# Patient Record
Sex: Female | Born: 2000 | Hispanic: No | Marital: Single | Smoking: Never smoker
Health system: Southern US, Community
[De-identification: ages and names within clinical notes are randomized; demographics above are authoritative.]

## PROBLEM LIST (undated history)

## (undated) DIAGNOSIS — F909 Attention-deficit hyperactivity disorder, unspecified type: Secondary | ICD-10-CM

## (undated) DIAGNOSIS — H5213 Myopia, bilateral: Secondary | ICD-10-CM

## (undated) DIAGNOSIS — L309 Dermatitis, unspecified: Secondary | ICD-10-CM

## (undated) HISTORY — DX: Attention-deficit hyperactivity disorder, unspecified type: F90.9

## (undated) HISTORY — DX: Myopia, bilateral: H52.13

## (undated) HISTORY — DX: Dermatitis, unspecified: L30.9

---
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
## 2021-02-21 ENCOUNTER — Encounter (HOSPITAL_COMMUNITY): Payer: Self-pay | Admitting: Emergency Medicine

## 2021-02-21 ENCOUNTER — Emergency Department (HOSPITAL_COMMUNITY)
Admission: EM | Admit: 2021-02-21 | Discharge: 2021-02-21 | Disposition: A | Discharge status: Home / Self Care | Payer: Medicaid Other | Attending: Emergency Medicine | Admitting: Emergency Medicine

## 2021-02-21 ENCOUNTER — Other Ambulatory Visit: Payer: Self-pay

## 2021-02-21 DIAGNOSIS — W25XXXA Contact with sharp glass, initial encounter: Secondary | ICD-10-CM | POA: Insufficient documentation

## 2021-02-21 DIAGNOSIS — Y93G1 Activity, food preparation and clean up: Secondary | ICD-10-CM | POA: Diagnosis not present

## 2021-02-21 DIAGNOSIS — S61011A Laceration without foreign body of right thumb without damage to nail, initial encounter: Secondary | ICD-10-CM | POA: Insufficient documentation

## 2021-02-21 DIAGNOSIS — S60931A Unspecified superficial injury of right thumb, initial encounter: Secondary | ICD-10-CM | POA: Diagnosis present

## 2021-02-21 MED ORDER — BACITRACIN ZINC 500 UNIT/GM EX OINT
1.0000 "application " | TOPICAL_OINTMENT | Freq: Two times a day (BID) | CUTANEOUS | Status: DC
  Administered 2021-02-21: 1 via TOPICAL
  Filled 2021-02-21: qty 0.9

## 2021-02-21 MED ORDER — LIDOCAINE-EPINEPHRINE 2 %-1:100000 IJ SOLN: 20.0000 mL | Freq: Once | INTRAMUSCULAR | Status: AC

## 2021-02-21 MED ORDER — LIDOCAINE-EPINEPHRINE 2 %-1:100000 IJ SOLN
INTRAMUSCULAR | Status: AC
  Administered 2021-02-21: 20 mL
  Filled 2021-02-21: qty 1

## 2021-02-21 NOTE — ED Triage Notes (Signed)
Patient states she was washing dishes and a cup broke. Cut noted to right thumb on the lower knuckle. Minimal bleeding.

## 2021-02-21 NOTE — ED Provider Notes (Signed)
[facility] DEPT Provider Note   CSN: [id number] Arrival date & time: 02/21/21  0133     History Chief Complaint  Patient presents with  . Laceration    Pamela Fernandez is a 20 y.o. female.  Patient presents to the emergency department with a chief complaint of laceration.  She states that she was doing dishes and a glass broke cutting her right thumb.  She denies any other injuries.  Denies any difficulty moving her thumb.  Bleeding has been difficult to control.  The history is provided by the patient. No language interpreter was used.       Past Medical History:  Diagnosis Date  . ADHD (attention deficit hyperactivity disorder)   . At risk for tuberculosis 04/13/2018   Travel to [place],  summer 2018 May 2019 not yet tested for latent tuberculosis   . Eczema   . Myopia of both eyes 10/15/2013    Patient Active Problem List   Diagnosis Date Noted  . Hx of foreign travel 02/14/2019  . Bereavement 02/13/2019  . Goiter 09/09/2015  . Allergic rhinitis 08/19/2014  . Allergic conjunctivitis 08/19/2014  . Eczema 08/19/2014  . ADHD (attention deficit hyperactivity disorder) 10/15/2013  . Myopia of both eyes 10/15/2013    History reviewed. No pertinent surgical history.   OB History   No obstetric history on file.     Family History  Problem Relation Age of Onset  . Hypertension Mother   . Asthma Brother   . Eczema Brother   . Thyroid disease Other     Social History   Tobacco Use  . Smoking status: Never Smoker  . Smokeless tobacco: Never Used  Substance Use Topics  . Alcohol use: No  . Drug use: No    Home Medications Prior to Admission medications   Medication Sig Start Date End Date Taking? Authorizing Provider  cetirizine (ZYRTEC) 10 MG tablet Take 1 tablet (10 mg total) by mouth daily. 04/07/20   [name], MD  ferrous sulfate 325 (65 FE) MG tablet Take 1 tablet (325 mg total) by mouth daily. 10/10/19   [name], MD  fluticasone (FLONASE) 50 MCG/ACT nasal spray Place 1 spray into both nostrils daily. 1 spray in each nostril every day 04/07/20   [name], MD  Olopatadine HCl 0.2 % SOLN Apply 1 drop to eye daily. 04/07/20   [name], MD  triamcinolone ointment (KENALOG) 0.1 % APPLY TO AFFECTED AREA TWICE A DAY 04/07/20   [name], MD  triamcinolone ointment (KENALOG) 0.5 % Apply 1 application topically 2 (two) times daily. For moderate to severe eczema.  Do not use for more than 1 week at a time. 04/07/20   [name], MD  VYVANSE 40 MG capsule Take 1 capsule (40 mg total) by mouth every morning. 12/22/20 01/21/21  [name], MD  VYVANSE 40 MG capsule Take 1 capsule (40 mg total) by mouth every morning. 11/21/20 12/21/20  [name], MD  VYVANSE 40 MG capsule Take 1 capsule (40 mg total) by mouth every morning. 10/22/20 11/22/20  [name], MD    Allergies    Patient has no known allergies.  Review of Systems   Review of Systems  All other systems reviewed and are negative.   Physical Exam Updated Vital Signs BP 118/73 (BP Location: Right Arm)   Pulse 80   Temp 98.3 F (36.8 C) (Oral)   Resp 18   SpO2 100%   Physical Exam Vitals and  nursing note reviewed.  Constitutional:      General: She is not in acute distress.    Appearance: She is well-developed.  HENT:     Head: Normocephalic and atraumatic.  Eyes:     Conjunctiva/sclera: Conjunctivae normal.  Cardiovascular:     Rate and Rhythm: Normal rate.     Heart sounds: No murmur heard.   Pulmonary:     Effort: Pulmonary effort is normal. No respiratory distress.  Abdominal:     General: There is no distension.  Musculoskeletal:     Cervical back: Neck supple.     Comments: Moves all extremities  Skin:    General: Skin is warm and dry.     Comments: 1 cm laceration over the right first MCP, no tendon, ligament, or joint involvement.  Neurological:     Mental Status: She  is alert and oriented to person, place, and time.  Psychiatric:        Mood and Affect: Mood normal.        Behavior: Behavior normal.     ED Results / Procedures / Treatments   Labs (all labs ordered are listed, but only abnormal results are displayed) Labs Reviewed - No data to display  EKG None  Radiology No results found.  Procedures .Marland KitchenLaceration Repair  Date/Time: 02/21/2021 3:43 AM Performed by: Montine Circle, PA-C Authorized by: Montine Circle, PA-C   Consent:    Consent obtained:  Verbal   Consent given by:  Patient   Risks discussed:  Infection, need for additional repair, pain, poor cosmetic result and poor wound healing   Alternatives discussed:  No treatment and delayed treatment Universal protocol:    Procedure explained and questions answered to patient or proxy's satisfaction: yes     Relevant documents present and verified: yes     Test results available: yes     Imaging studies available: yes     Required blood products, implants, devices, and special equipment available: yes     Site/side marked: yes     Immediately prior to procedure, a time out was called: yes     Patient identity confirmed:  Verbally with patient Anesthesia:    Anesthesia method:  Local infiltration Laceration details:    Location:  Finger   Finger location:  R thumb   Length (cm):  1 Pre-procedure details:    Preparation:  Patient was prepped and draped in usual sterile fashion Exploration:    Wound exploration: wound explored through full range of motion and entire depth of wound visualized     Wound extent: no foreign bodies/material noted, no muscle damage noted, no tendon damage noted and no vascular damage noted     Contaminated: no   Treatment:    Irrigation solution:  Tap water Skin repair:    Repair method:  Sutures   Suture size:  5-0   Suture material:  Prolene   Number of sutures:  3 Approximation:    Approximation:  Close Repair type:    Repair type:   Simple Post-procedure details:    Dressing:  Antibiotic ointment and adhesive bandage   Procedure completion:  Tolerated well, no immediate complications     Medications Ordered in ED Medications  lidocaine-EPINEPHrine (XYLOCAINE W/EPI) 2 %-1:100000 (with pres) injection (has no administration in time range)    ED Course  I have reviewed the triage vital signs and the nursing notes.  Pertinent labs & imaging results that were available during my care of the patient were  reviewed by me and considered in my medical decision making (see chart for details).    MDM Rules/Calculators/A&P                          Patient here with simple laceration to the posterior aspect of the right thumb, no tendon involvement.  Repaired without difficulty or complication. Final Clinical Impression(s) / ED Diagnoses Final diagnoses:  Laceration of right thumb without foreign body without damage to nail, initial encounter    Rx / DC Orders ED Discharge Orders    None       Montine Circle, PA-C 01/56/15 [id number]    Delora Fuel, MD 32/76/14 2239

## 2021-02-22 ENCOUNTER — Telehealth: Payer: Self-pay | Admitting: *Deleted

## 2021-02-22 NOTE — Telephone Encounter (Signed)
Transition Care Management Unsuccessful Follow-up Telephone Call  Date of discharge and from where:  02/21/2021 - [facility]  Attempts:  1st Attempt  Reason for unsuccessful TCM follow-up call:  Left voice message

## 2021-02-23 NOTE — Telephone Encounter (Signed)
Transition Care Management Unsuccessful Follow-up Telephone Call  Date of discharge and from where:  02/21/2021 - [facility]  Attempts:  2nd Attempt  Reason for unsuccessful TCM follow-up call:  Voice mail full

## 2021-02-24 NOTE — Telephone Encounter (Signed)
Transition Care Management Unsuccessful Follow-up Telephone Call  Date of discharge and from where:  02/21/2021 - [facility]  Attempts:  3rd Attempt  Reason for unsuccessful TCM follow-up call:  Voice mail full   Has appointment with PCP on 02/25/2021

## 2021-02-25 ENCOUNTER — Ambulatory Visit (INDEPENDENT_AMBULATORY_CARE_PROVIDER_SITE_OTHER): Payer: Medicaid Other | Admitting: Pediatrics

## 2021-02-25 ENCOUNTER — Other Ambulatory Visit: Payer: Self-pay

## 2021-02-25 VITALS — Temp 97.3°F | Wt 108.0 lb

## 2021-02-25 DIAGNOSIS — R000 Tachycardia, unspecified: Secondary | ICD-10-CM | POA: Diagnosis not present

## 2021-02-25 DIAGNOSIS — F900 Attention-deficit hyperactivity disorder, predominantly inattentive type: Secondary | ICD-10-CM

## 2021-02-25 DIAGNOSIS — L659 Nonscarring hair loss, unspecified: Secondary | ICD-10-CM

## 2021-02-25 DIAGNOSIS — E559 Vitamin D deficiency, unspecified: Secondary | ICD-10-CM | POA: Diagnosis not present

## 2021-02-25 DIAGNOSIS — E049 Nontoxic goiter, unspecified: Secondary | ICD-10-CM

## 2021-02-25 DIAGNOSIS — R634 Abnormal weight loss: Secondary | ICD-10-CM

## 2021-02-25 MED ORDER — VYVANSE 40 MG PO CAPS: 40.0000 mg | ORAL_CAPSULE | ORAL | 0 refills | Status: DC

## 2021-02-25 MED ORDER — VYVANSE 40 MG PO CAPS
40.0000 mg | ORAL_CAPSULE | ORAL | 0 refills | Status: DC
Start: 2021-04-27 — End: 2021-05-31

## 2021-02-25 MED ORDER — VYVANSE 40 MG PO CAPS
40.0000 mg | ORAL_CAPSULE | ORAL | 0 refills | Status: DC
Start: 2021-03-28 — End: 2021-05-31

## 2021-02-25 NOTE — Progress Notes (Signed)
Subjective:     Pamela Fernandez, is a 20 y.o. female  HPI  Chief Complaint  Patient presents with  . Anemia    She is requesting blood work, she has hair loss, irregular heart beat   At [employer], last sememster was easy Took too many  (7 ) classes this semester; had to drop one,   More than usual hair loss  Hair loss at front scalp forehead area noticible Using minoxidil cream More since January  Never had Story Mom and dad were sick October--light cold Patient had no symptoms and was tested--negative Daughter was COVID 12/2019   For two weeks, has had several troubling to her symptoms Did internet search, and she thinks it is hypothyroid Constipation, Trouble sleeping--insomnia Cold intolerence, but also sweating At same time, had  A lot of exams and couldn't remember anyting Had dry skin,  Some of the symptoms better: but still constipation Heart felt fast Particularly,  the night she could n't sleep , felt her heart in her head  Mom reports patient is a straight A student and that she worries about her grades all the time.  Mom says patient is Always worried, always stressed   ADHD Needs refill Duration mid afternoon--then can't study again Has been on afternood [facility] in past Was on vyvanse 34 and took two pills as a time (on her own) because vyvanse wasn't working--how she got on 40 from 20 without trial of 30  Needs to gain weight --has some recent weight loss No eat in the morning --is hungry, but no time Eat junk food, lots of it when hungry Eats better in morning before takes Vyvanse, later in the day, she is not hungry for a while  Review of Systems   The following portions of the patient's history were reviewed and updated as appropriate: allergies, current medications, past family history, past medical history, past social history, past surgical history and problem list.  History and Problem List: Pamela Fernandez has ADHD (attention deficit hyperactivity disorder);  Myopia of both eyes; Allergic rhinitis; Allergic conjunctivitis; Eczema; Goiter; Bereavement; and Hx of foreign travel on their problem list.  Pamela Fernandez  has a past medical history of ADHD (attention deficit hyperactivity disorder), At risk for tuberculosis (04/13/2018), Eczema, and Myopia of both eyes (10/15/2013).     Objective:     Temp (!) 97.3 F (36.3 C) (Temporal)   Wt 108 lb (49 kg)   BMI 19.13 kg/m   Physical Exam Constitutional:      Appearance: Normal appearance.     Comments: Always thin, no significant visible difference in weight  HENT:     Head: Normocephalic.     Comments: Thick hair, no areas of frank alopecia, possible thinning along forehead    Right Ear: Tympanic membrane normal.     Left Ear: Tympanic membrane normal.     Nose: Nose normal.     Mouth/Throat:     Mouth: Mucous membranes are moist.     Pharynx: Oropharynx is clear.  Eyes:     Conjunctiva/sclera: Conjunctivae normal.  Neck:     Comments: Goiter present, but no increased,  Cardiovascular:     Rate and Rhythm: Normal rate and regular rhythm.     Heart sounds: No murmur heard.   Pulmonary:     Effort: Pulmonary effort is normal.     Breath sounds: Normal breath sounds.  Abdominal:     General: Abdomen is flat.     Palpations: Abdomen is soft.  Tenderness: There is no abdominal tenderness.  Musculoskeletal:     Cervical back: Normal range of motion. No tenderness.  Neurological:     Mental Status: She is alert.        Assessment & Plan:   1. Attention deficit hyperactivity disorder (ADHD), predominantly inattentive type  I am willing to add on an afternoon dose of methylphenidate as she was on in the past except that, with her recent weight loss and anxiety, I would like to decrease her morning dose to Vyvanse 30. She was unwilling to consider a decrease to Vyvanse of 30 in the morning and preferred to continue with Vyvanse 40 without an afternoon dose.  - VYVANSE 40 MG capsule;  Take 1 capsule (40 mg total) by mouth every morning.  Dispense: 30 capsule; Refill: 0 - VYVANSE 40 MG capsule; Take 1 capsule (40 mg total) by mouth every morning.  Dispense: 30 capsule; Refill: 0 - VYVANSE 40 MG capsule; Take 1 capsule (40 mg total) by mouth every morning.  Dispense: 30 capsule; Refill: 0  2. 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 Appropriate to check screening for anemia and thyroid as well as vitamin D as she wears a head scarf and stays indoors most of the day. Results included normal CBC and low vitamin D  Recommended daily multivitamin with iron and 1000 units vitamin D3 daily  - VITAMIN D 25 Hydroxy (Vit-D Deficiency, Fractures) - CBC  3. Goiter Thyroid thyroid studies were normal in November but has new concern regarding both high heart rate and hair loss will be checked today  - T4, free - TSH  4. Tachycardia Seems to have been particularly associated with 1 night when she was having trouble sleeping and thinking.  Exam is normal. Not concerned about cardiac origin  5. Weight loss Strongly encouraged to eat breakfast.  A glass of milk or a piece of toast might be sufficient.  Take food with her that she can eat between classes.  Needs to gain a little weight before could add an afternoon dose.  3 months of Vyvanse provided but would recommend return visit sooner if she has recurring symptoms  Supportive care and return precautions reviewed.  Spent  30  minutes reviewing charts, discussing diagnosis and treatment plan with patient, documentation and case coordination.   [name], MD

## 2021-02-26 ENCOUNTER — Encounter: Payer: Self-pay | Admitting: Pediatrics

## 2021-02-26 LAB — CBC
HCT: 35.5 % (ref 34.0–46.0)
Hemoglobin: 11.7 g/dL (ref 11.5–15.3)
MCH: 25.7 pg (ref 25.0–35.0)
MCHC: 33 g/dL (ref 31.0–36.0)
MCV: 77.9 fL — ABNORMAL LOW (ref 78.0–98.0)
MPV: 11.5 fL (ref 7.5–12.5)
Platelets: 273 10*3/uL (ref 140–400)
RBC: 4.56 10*6/uL (ref 3.80–5.10)
RDW: 13.4 % (ref 11.0–15.0)
WBC: 5 10*3/uL (ref 4.5–13.0)

## 2021-02-26 LAB — T4, FREE: Free T4: 1.4 ng/dL (ref 0.8–1.4)

## 2021-02-26 LAB — VITAMIN D 25 HYDROXY (VIT D DEFICIENCY, FRACTURES): Vit D, 25-Hydroxy: 16 ng/mL — ABNORMAL LOW (ref 30–100)

## 2021-02-26 LAB — TSH: TSH: 1.65 mIU/L

## 2021-02-26 NOTE — Patient Instructions (Signed)

## 2021-05-27 ENCOUNTER — Ambulatory Visit: Payer: Medicaid Other | Admitting: Pediatrics

## 2021-05-31 ENCOUNTER — Ambulatory Visit (INDEPENDENT_AMBULATORY_CARE_PROVIDER_SITE_OTHER): Payer: Medicaid Other | Admitting: Pediatrics

## 2021-05-31 ENCOUNTER — Other Ambulatory Visit: Payer: Self-pay

## 2021-05-31 ENCOUNTER — Encounter: Payer: Self-pay | Admitting: Pediatrics

## 2021-05-31 VITALS — BP 120/68 | HR 60 | Ht 62.91 in | Wt 109.0 lb

## 2021-05-31 DIAGNOSIS — J302 Other seasonal allergic rhinitis: Secondary | ICD-10-CM

## 2021-05-31 DIAGNOSIS — F900 Attention-deficit hyperactivity disorder, predominantly inattentive type: Secondary | ICD-10-CM

## 2021-05-31 DIAGNOSIS — L2082 Flexural eczema: Secondary | ICD-10-CM

## 2021-05-31 DIAGNOSIS — E559 Vitamin D deficiency, unspecified: Secondary | ICD-10-CM

## 2021-05-31 MED ORDER — VYVANSE 40 MG PO CAPS: 40.0000 mg | ORAL_CAPSULE | ORAL | 0 refills | Status: DC

## 2021-05-31 NOTE — Progress Notes (Signed)
Subjective:     Pamela Fernandez, is a 20 y.o. female  HPI  Chief Complaint  Patient presents with   Follow-up    ADHD   Has an exam today at [employer] It is a memorized of Islam --nervous for it,  Needs to be seen for refills for Vyvanse Has been on the same , Stable dose  First year in college; it was hard Especially the second semester On track to be accepted to [employer] , living at home Moving to another house in [place] soon About to start work at [employer] start next week  Ran out a while ago Vyvanse 40 mg  Is the right dose No Side effects: no HA, no dizzy, no abd pain, no mood changes such as anxiety or depression   Had hair loss Hair using spray minoxidil  From Dermatologist--in [place]-no notes able to be seen   No longer taking iron  Allergies are fine, ok for refills if needed   Takes vit D: 2 gummy vit d   Skin--dry, but not exacerbation No using steroid cream often  Is using moisturizer  Review of Systems   The following portions of the patient's history were reviewed and updated as appropriate: allergies, current medications, past family history, past medical history, past social history, past surgical history, and problem list.  History and Problem List: Pamela Fernandez has ADHD (attention deficit hyperactivity disorder); Myopia of both eyes; Allergic rhinitis; Allergic conjunctivitis; Eczema; Goiter; Bereavement; and Hx of foreign travel on their problem list.  Pamela Fernandez  has a past medical history of ADHD (attention deficit hyperactivity disorder), At risk for tuberculosis (04/13/2018), Eczema, and Myopia of both eyes (10/15/2013).     Objective:     BP 120/68 (BP Location: Right Arm, Patient Position: Sitting, Cuff Size: Normal)   Pulse 60   Ht 5' 2.91" (1.598 m)   Wt 109 lb (49.4 kg)   BMI 19.36 kg/m   Physical Exam Vitals and nursing note reviewed.  Constitutional:      General: She is not in acute distress. HENT:     Head:  Normocephalic and atraumatic.     Comments: Slight goiter    Right Ear: External ear normal.     Left Ear: External ear normal.     Nose: Nose normal.  Eyes:     General:        Right eye: No discharge.        Left eye: No discharge.     Conjunctiva/sclera: Conjunctivae normal.  Cardiovascular:     Rate and Rhythm: Normal rate and regular rhythm.     Heart sounds: Normal heart sounds.  Pulmonary:     Effort: No respiratory distress.     Breath sounds: No wheezing or rales.  Musculoskeletal:     Cervical back: Normal range of motion.  Skin:    General: Skin is warm and dry.     Comments: Bilateral antecubital with slightly thickened and day dry areas with occasional pinpoint scab       Assessment & Plan:   1. 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 Vitamin D deficiency Taking sumplements  3. Seasonal allergic rhinitis, unspecified trigger Stable, ok for refills if needed  4. Flexural eczema Could benefit from more moisturizer and steroid cream occasionally,  No active infection  Thyroid studies normal with last check in 02/2021  Supportive care  and return precautions reviewed.  Spent  30  minutes reviewing charts, discussing diagnosis and treatment plan with patient, documentation .   [name], MD

## 2021-07-19 ENCOUNTER — Ambulatory Visit (INDEPENDENT_AMBULATORY_CARE_PROVIDER_SITE_OTHER): Payer: Medicaid Other | Admitting: Pediatrics

## 2021-07-19 ENCOUNTER — Encounter: Payer: Self-pay | Admitting: Pediatrics

## 2021-07-19 ENCOUNTER — Other Ambulatory Visit: Payer: Self-pay

## 2021-07-19 VITALS — Temp 98.9°F | Ht 63.0 in | Wt 110.5 lb

## 2021-07-19 DIAGNOSIS — B349 Viral infection, unspecified: Secondary | ICD-10-CM | POA: Diagnosis not present

## 2021-07-19 LAB — POC SOFIA SARS ANTIGEN FIA: SARS Coronavirus 2 Ag: NEGATIVE

## 2021-07-19 LAB — POCT RAPID STREP A (OFFICE): Rapid Strep A Screen: NEGATIVE

## 2021-07-19 NOTE — Progress Notes (Signed)
PCP: [name], MD   Chief Complaint  Patient presents with   Sore Throat      Subjective:  HPI:  Pamela Fernandez is a 20 y.o. female presenting with one day of body aches, chills, sore throat, difficulty swallowing. Symptoms started yesterday afternoon. No cough, rhinorrhea, rash, vomiting, diarrhea. Some nausea. One of her classmates is sick. No one at home sick. Not eating since yesterday. Still drinking water. Took Tylenol yesterday, none today.    REVIEW OF SYSTEMS:  All others negative except otherwise noted above.    Meds: Current Outpatient Medications  Medication Sig Dispense Refill   cetirizine (ZYRTEC) 10 MG tablet Take 1 tablet (10 mg total) by mouth daily. 30 tablet 5   fluticasone (FLONASE) 50 MCG/ACT nasal spray Place 1 spray into both nostrils daily. 1 spray in each nostril every day 16 g 5   triamcinolone ointment (KENALOG) 0.1 % APPLY TO AFFECTED AREA TWICE A DAY 80 g 1   triamcinolone ointment (KENALOG) 0.5 % Apply 1 application topically 2 (two) times daily. For moderate to severe eczema.  Do not use for more than 1 week at a time. 60 g 0   [START ON 07/31/2021] VYVANSE 40 MG capsule Take 1 capsule (40 mg total) by mouth every morning. 30 capsule 0   VYVANSE 40 MG capsule Take 1 capsule (40 mg total) by mouth every morning. 30 capsule 0   VYVANSE 40 MG capsule Take 1 capsule (40 mg total) by mouth every morning. 30 capsule 0   No current facility-administered medications for this visit.    ALLERGIES: No Known Allergies  PMH:  Past Medical History:  Diagnosis Date   ADHD (attention deficit hyperactivity disorder)    At risk for tuberculosis 04/13/2018   Travel to [place],  summer 2018 May 2019 not yet tested for latent tuberculosis    Eczema    Myopia of both eyes 10/15/2013    PSH: No past surgical history on file.  Social history:  Social History   Social History Narrative   Lives with parents and 3 sibs.  She is the youngest.     Family  history: Family History  Problem Relation Age of Onset   Hypertension Mother    Asthma Brother    Eczema Brother    Thyroid disease Other      Objective:   Physical Examination:  Temp: 98.9 F (37.2 C) Pulse:   BP:   (Blood pressure percentiles are not available for patients who are 18 years or older.)  Wt: 110 lb 8 oz (50.1 kg)  Ht: '5\' 3"'$  (1.6 m)  BMI: Body mass index is 19.57 kg/m. (21 %ile (Z= -0.80) based on CDC (Girls, 2-20 Years) BMI-for-age based on BMI available as of 05/31/2021 from contact on 05/31/2021.) GENERAL: Well appearing, no distress HEENT: NCAT, clear sclerae, TMs normal bilaterally, no nasal discharge, mild posterior oropharynx erythema, no exudate, MMM NECK: Supple, no cervical LAD LUNGS: EWOB, CTAB, no wheeze, no crackles CARDIO: RRR, normal S1S2 no murmur, well perfused,  ABDOMEN: soft, ND/NT, no masses or organomegaly EXTREMITIES: Warm and well perfused, no deformity NEURO: Awake, alert, interactive, normal strength, tone, and gait SKIN: No rash, ecchymosis or petechiae     Assessment/Plan:   Pamela Fernandez is a 20 y.o. old female here for suspected viral illness. On exam, there was some mild posterior oropharynx erythema with no exudate; she is afebrile. No lymphadenopathy. POCT covid and rapid strep both negative. Throat culture sent.   1. Viral  illness - Counseled on supportive measures - Return precautions given  - POC SOFIA Antigen FIA negative  - POCT rapid strep A negative  - Culture, Group A Strep pending   Follow up: Return if symptoms worsen or fail to improve.

## 2021-07-21 LAB — CULTURE, GROUP A STREP
MICRO NUMBER:: 12213584
SPECIMEN QUALITY:: ADEQUATE

## 2021-08-09 ENCOUNTER — Telehealth: Payer: Self-pay | Admitting: Pediatrics

## 2021-08-09 DIAGNOSIS — F900 Attention-deficit hyperactivity disorder, predominantly inattentive type: Secondary | ICD-10-CM

## 2021-08-09 MED ORDER — VYVANSE 40 MG PO CAPS: 40.0000 mg | ORAL_CAPSULE | ORAL | 0 refills | Status: DC

## 2021-08-09 NOTE — Telephone Encounter (Signed)
CALL BACK NUMBER:  [phone]  MEDICATION(S): VYVANSE 40 MG capsule  PREFERRED PHARMACY: [id number] - [place], [place] - [address]  ARE YOU CURRENTLY COMPLETELY OUT OF THE MEDICATION? :  yes

## 2021-08-09 NOTE — Telephone Encounter (Signed)
Refill for Vyvanse requested  Has been on stable dose with generally reasonable follow up. Last seen June  Refill provided until time of well exam and ADHD  Again noted to be time for transition to adult care.

## 2021-08-09 NOTE — Telephone Encounter (Signed)
Last ADHD f/u visit with Dr. Jess Barters 05/31/21; PE/ADHD appointment is scheduled 09/28/21.

## 2021-08-10 NOTE — Telephone Encounter (Signed)
I called number provided and left message on generic VM that RX has been sent as requested; should be sufficient amount to bridge until PE 09/28/21 at 11:00 am.

## 2021-08-13 DIAGNOSIS — H538 Other visual disturbances: Secondary | ICD-10-CM | POA: Diagnosis not present

## 2021-08-31 DIAGNOSIS — H5213 Myopia, bilateral: Secondary | ICD-10-CM | POA: Diagnosis not present

## 2021-09-09 ENCOUNTER — Other Ambulatory Visit: Payer: Self-pay | Admitting: Pediatrics

## 2021-09-09 DIAGNOSIS — J301 Allergic rhinitis due to pollen: Secondary | ICD-10-CM

## 2021-09-11 NOTE — Telephone Encounter (Signed)
Refill request received for olopatadine  Last seen 05/2021 Last seen for this problem, 05/2021,:   Has history of allergic conjunctivitis, Olopatadine is no longer listed as covered by insurance  I can refill it, but it is likely to be very expensive. Remember that it has to be use everyday for best effect.  Also some people get benefit from flonase and cetirizine use regularly for eye allergies   Refill approved but please call family,

## 2021-09-13 NOTE — Telephone Encounter (Signed)
I spoke with dad and relayed message from Dr. Jess Barters; they will purchase eye drops OTC if flonase and cetirizine do not control symptoms adequately.

## 2021-09-27 DIAGNOSIS — S93601A Unspecified sprain of right foot, initial encounter: Secondary | ICD-10-CM | POA: Diagnosis not present

## 2021-09-28 ENCOUNTER — Other Ambulatory Visit: Payer: Self-pay

## 2021-09-28 ENCOUNTER — Other Ambulatory Visit (HOSPITAL_COMMUNITY)
Admission: RE | Admit: 2021-09-28 | Discharge: 2021-09-28 | Disposition: A | Discharge status: Home / Self Care | Payer: Medicaid Other | Source: Ambulatory Visit | Attending: Pediatrics | Admitting: Pediatrics

## 2021-09-28 ENCOUNTER — Encounter: Payer: Self-pay | Admitting: Pediatrics

## 2021-09-28 ENCOUNTER — Ambulatory Visit (INDEPENDENT_AMBULATORY_CARE_PROVIDER_SITE_OTHER): Payer: Medicaid Other | Admitting: Pediatrics

## 2021-09-28 VITALS — BP 112/58 | HR 79 | Ht 63.11 in | Wt 109.8 lb

## 2021-09-28 DIAGNOSIS — B360 Pityriasis versicolor: Secondary | ICD-10-CM

## 2021-09-28 DIAGNOSIS — Z114 Encounter for screening for human immunodeficiency virus [HIV]: Secondary | ICD-10-CM

## 2021-09-28 DIAGNOSIS — L659 Nonscarring hair loss, unspecified: Secondary | ICD-10-CM

## 2021-09-28 DIAGNOSIS — F900 Attention-deficit hyperactivity disorder, predominantly inattentive type: Secondary | ICD-10-CM

## 2021-09-28 DIAGNOSIS — Z0001 Encounter for general adult medical examination with abnormal findings: Secondary | ICD-10-CM | POA: Diagnosis not present

## 2021-09-28 DIAGNOSIS — Z7187 Encounter for pediatric-to-adult transition counseling: Secondary | ICD-10-CM

## 2021-09-28 DIAGNOSIS — Z6841 Body Mass Index (BMI) 40.0 and over, adult: Secondary | ICD-10-CM

## 2021-09-28 DIAGNOSIS — Z113 Encounter for screening for infections with a predominantly sexual mode of transmission: Secondary | ICD-10-CM | POA: Insufficient documentation

## 2021-09-28 DIAGNOSIS — J301 Allergic rhinitis due to pollen: Secondary | ICD-10-CM

## 2021-09-28 LAB — POCT RAPID HIV: Rapid HIV, POC: NEGATIVE

## 2021-09-28 MED ORDER — VYVANSE 40 MG PO CAPS: 40.0000 mg | ORAL_CAPSULE | ORAL | 0 refills | Status: AC

## 2021-09-28 MED ORDER — KETOCONAZOLE 2 % EX CREA: 1.0000 "application " | TOPICAL_CREAM | Freq: Every day | CUTANEOUS | 0 refills | Status: AC

## 2021-09-28 NOTE — Patient Instructions (Addendum)
Teenagers need at least 1300 mg of calcium per day, as they have to store calcium in bone for the future.  And they need at least 1000 IU of vitamin D3.every day.   Good food sources of calcium are dairy (yogurt, cheese, milk), orange juice with added calcium and vitamin D3, and dark leafy greens.  Taking two extra strength Tums with meals gives a good amount of calcium.    It's hard to get enough vitamin D3 from food, but orange juice, with added calcium and vitamin D3, helps.  A daily dose of 20-30 minutes of sunlight also helps.    The easiest way to get enough vitamin D3 is to take a supplement.  It's easy and inexpensive.  Teenagers need at least 1000 IU per day.  Adult Primary Care Clinics Name [facility]  Address: [place], [place] [address]  Phone: [phone] Hours: Monday - Friday 9 AM -6 PM  Types of insurance accepted:  Commercial insurance [place] (orange card) [employer] Uninsured  Language services:  Video and phone interpreters available   Ages 83 and older    Adult primary care Onsite pharmacy Integrated behavioral health Financial assistance counseling Walk-in hours for established patients  Financial assistance counseling hours: Tuesdays 2:00PM - 5:00PM  Thursday 8:30AM - 4:30PM  Space is limited, 10 on Tuesday and 20 on Thursday. It's on first come first serve basis  Name [facility]  Address: [address] [place], [place] [address]  Phone: [phone]  Hours: Monday - Friday 8:30 AM - 5 PM  Types of insurance accepted:  Commercial insurance Medicaid Medicare Uninsured  Language services:  Video and phone interpreters available   All ages - newborn to adult   Primary care for all ages (children and adults) Integrated behavioral health Nutritionist Financial assistance counseling   Name  [facility] on the ground floor of [facility]  Address: [address],  [place]  [address]  Phone: [phone]  Hours: Monday - Friday 8:15 AM - 5 PM  Types of insurance accepted:  Commercial insurance Medicaid Medicare Uninsured  Language services:  Video and phone interpreters available   Ages 57 and older   Adult primary care Nutritionist Certified Diabetes Educator  Integrated behavioral health Financial assistance counseling   Name [facility]  Address: [address] [place],  [address]  Phone: [phone]  Hours: Monday - Friday 8:30 AM - 5 PM    Types of insurance accepted:  Pharmacist, community Medicaid Medicare Uninsured  Language services:  Video and phone interpreters available   All ages - newborn to adult   Primary care for all ages (children and adults) Integrated behavioral health Financial assistance counseling

## 2021-09-28 NOTE — Progress Notes (Signed)
Adolescent Well Care Visit Pamela Fernandez is a 20 y.o. female who is here for well care.    PCP:  [name], MD   History was provided by the patient.  Current Issues: Current concerns include   Hair thinning Saw derm Hair: thin on sides and especially on top---helped and then as soon as stopped using Minoxidil spray   Vit D--deficiency Level was 16  on 02/2021-- Now taking 1000 Vit D  Wears head scarf  Allergic rhinitis No need for refills right now  Has a new rash On her breasts, in axilla It is itchy , some read and cracking Using TAC--first 0.1 and just tried 0.5 since the 0.1 didn't help  Thyroid check 02/2021  Eczema on elbow is fine with occasional triamcinolone and lots of moisturizer   ADHD No longer taking vyvanse every single day More like three times a week Uses it every day during exam times No want a lower dose Used it for homework, classes Dose is perfect  No Side effects: no nausea, no HA, no problem sleeping, eats well  No organized when take it Might take at 3 pm-- Can always sleep  Nutrition: Nutrition/Eating Behaviors: eats well Adequate calcium in diet?:  Milk a lot of milk--2 cups  Supplements/ Vitamins: no  Exercise/ Media: Play any Sports?/ Exercise: not much exercise, thinks should get more Not good now Screen Time:   other than school, is limited Media Rules or Monitoring?: yes  Sleep:  Sleep: sleeping too much -more than 8 or 9 hours is too much Still feeling tired Does not have a regular sleep schedule  Social Screening: Lives with:  parents Parental relations:  good Activities, Work, and Research officer, 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  Stressors of note: just busy with school and work   Education: School Name: [employer]-- second year  Switched to tech--information systems--chasing the money  Not sure if would like the hospital environment for nursing  Doing well  Menstruation:   No LMP  recorded. Menstrual History:   Regular, no cramps,  No bleed too much   Social History: Tobacco?  no Secondhand smoke exposure?  no Drugs/ETOH?  no  Sexually Active?  no   Pregnancy Prevention: none  Screenings: Patient has a dental home: yes  The patient completed the Rapid Assessment for Adolescent Preventive Services screening questionnaire and the following topics were identified as risk factors and discussed: healthy eating and exercise   PHQ-9 completed and results indicated core 6, moderate risk   Physical Exam:  Vitals:   09/28/21 1118  BP: (!) 112/58  Pulse: 79  SpO2: 99%  Weight: 109 lb 12.8 oz (49.8 kg)  Height: 5' 3.11" (1.603 m)   BP (!) 112/58 (BP Location: Right Arm, Patient Position: Sitting)   Pulse 79   Ht 5' 3.11" (1.603 m)   Wt 109 lb 12.8 oz (49.8 kg)   SpO2 99%   BMI 19.38 kg/m  Body mass index: body mass index is 19.38 kg/m. Blood pressure percentiles are not available for patients who are 18 years or older.  Hearing Screening   500Hz  1000Hz  2000Hz  4000Hz   Right ear 20 20 20 20   Left ear 20 20 20 20    Vision Screening   Right eye Left eye Both eyes  Without correction     With correction 20/20 20/20 20/20   Comments: With glasses   General Appearance:   alert, oriented, no acute distress  HENT: Normocephalic, no  obvious abnormality, conjunctiva clear  Mouth:   Normal appearing teeth, no obvious discoloration, dental caries, or dental caps  Neck:   Supple; thyroid: no enlargement, symmetric, no tenderness/mass/nodules--no longer has goiter  Chest Bilateral breast with hyperpigmenation, splotchy, flat, mild erythema   Lungs:   Clear to auscultation bilaterally, normal work of breathing  Heart:   Regular rate and rhythm, S1 and S2 normal, no murmurs;   Abdomen:   Soft, non-tender, no mass, or organomegaly  GU genitalia not examined  Musculoskeletal:   Tone and strength strong and symmetrical, all extremities               Lymphatic:    No cervical adenopathy  Skin/Hair/Nails:   Bilateral antecubital area with hyprepigmentation, mild, alopecia at hair line on side and on top of forehead   Neurologic:   Strength, gait, and coordination normal and age-appropriate     Assessment and Plan:  1. Encounter for general adult medical examination with abnormal findings  No longer has goiter, continue checking annually  2. Routine screening for STI (sexually transmitted infection)  - Urine cytology ancillary only - POCT Rapid HIV  3. BMI 45.0-49.9, adult ([place]) And, but reports a good appetite and is not influenced by fire ants  4. Tinea versicolor  Start using triamcinolone on the breast and axilla rash It will likely make it worse Trial of antifungal cream for presumed tinea versicolor Use twice a day for 2 weeks Color change may resolve over several months  - ketoconazole (NIZORAL) 2 % cream; Apply 1 application topically daily.  Dispense: 60 g; Refill: 0  5. Seasonal allergic rhinitis due to pollen  Not currently having symptoms or needing refills. refills were okay to provide  6. Attention deficit hyperactivity disorder (ADHD), predominantly inattentive type  Stable dose of Vyvanse Refill for 40 for 3 months No side effects   7. Alopecia The growth seen with minoxidil spray will happen only while using minoxidil spray She is considering returning to dermatologist as alopecia is distressing to her She considers there may be some rubbing from her's head scarf, but she does not wear her head scarf very tightly Okay for really referral from [place] if needed  8. Counseling for transition from pediatric to adult care provider  Reviewed again how to obtain care from an alternative clinic She will probably go to the clinic where her brothers go Recommend she call for an appointment before her next refills are due for Vyvanse in 3 months  BMI is appropriate for age  Hearing screening result:normal Vision screening  result: normal   No follow-ups on file.[name], MD

## 2021-09-29 LAB — URINE CYTOLOGY ANCILLARY ONLY
Chlamydia: NEGATIVE
Comment: NEGATIVE
Comment: NORMAL
Neisseria Gonorrhea: NEGATIVE

## 2021-10-28 DIAGNOSIS — L649 Androgenic alopecia, unspecified: Secondary | ICD-10-CM | POA: Diagnosis not present

## 2023-12-05 ENCOUNTER — Encounter (HOSPITAL_COMMUNITY): Payer: Self-pay | Admitting: Emergency Medicine

## 2023-12-05 ENCOUNTER — Ambulatory Visit (HOSPITAL_COMMUNITY)
Admission: EM | Admit: 2023-12-05 | Discharge: 2023-12-05 | Disposition: A | Discharge status: Home / Self Care | Payer: Commercial Managed Care - HMO | Attending: Nurse Practitioner | Admitting: Nurse Practitioner

## 2023-12-05 DIAGNOSIS — M545 Low back pain, unspecified: Secondary | ICD-10-CM

## 2023-12-05 MED ORDER — KETOROLAC TROMETHAMINE 30 MG/ML IJ SOLN
INTRAMUSCULAR | Status: AC
  Filled 2023-12-05: qty 1

## 2023-12-05 MED ORDER — TIZANIDINE HCL 4 MG PO TABS: 4.0000 mg | ORAL_TABLET | Freq: Three times a day (TID) | ORAL | 0 refills | Status: AC | PRN

## 2023-12-05 MED ORDER — KETOROLAC TROMETHAMINE 30 MG/ML IJ SOLN
30.0000 mg | Freq: Once | INTRAMUSCULAR | Status: AC
  Administered 2023-12-05: 30 mg via INTRAMUSCULAR

## 2023-12-05 NOTE — ED Triage Notes (Signed)
Pt was snowboarding two days ago and fell on back. C/o lower right side back pain.

## 2023-12-05 NOTE — ED Notes (Signed)
Pt and family member is requesting imaging of back. Shanda Bumps NP made aware.

## 2023-12-05 NOTE — Discharge Instructions (Signed)
We have given you an anti-inflammatory injection today to help with pain.  Please avoid NSAIDs for 48 hours, then you can take ibuprofen 400 mg every 8 hours for pain.  Continue Tylenol 500 to 1000 mg every 6 hours for pain.  You can also take the tizanidine every 8 hours as needed for muscular pain.  Seek care if symptoms do not improve with treatment.

## 2023-12-05 NOTE — ED Provider Notes (Signed)
[facility]    CSN: [id number] Arrival date & time: 12/05/23  1425      History   Chief Complaint Chief Complaint  Patient presents with   Fall   Back Pain    HPI Pamela Fernandez is a 22 y.o. female.   Patient presents today with 2-day history of right low back pain that began after she fell while snowboarding.  Reports she fell on a hard snowball.  Reports she had pain immediately after.  Denies new bowel or bladder incontinence, numbness or tingling going down the legs, decreased sensation lower extremities, weakness of lower extremities.  No burning with urination, increased urinary frequency or urgency, or hematuria.  Has taken Tylenol for the pain which helps minimally.    Past Medical History:  Diagnosis Date   ADHD (attention deficit hyperactivity disorder)    At risk for tuberculosis 04/13/2018   Travel to [place],  summer 2018 May 2019 not yet tested for latent tuberculosis    Eczema    Myopia of both eyes 10/15/2013    Patient Active Problem List   Diagnosis Date Noted   Hx of foreign travel 02/14/2019   Bereavement 02/13/2019   Goiter 09/09/2015   Allergic rhinitis 08/19/2014   Allergic conjunctivitis 08/19/2014   Eczema 08/19/2014   ADHD (attention deficit hyperactivity disorder) 10/15/2013   Myopia of both eyes 10/15/2013    History reviewed. No pertinent surgical history.  OB History   No obstetric history on file.      Home Medications    Prior to Admission medications   Medication Sig Start Date End Date Taking? Authorizing Provider  cetirizine (ZYRTEC) 10 MG tablet Take 1 tablet (10 mg total) by mouth daily. 04/07/20  Yes Theadore Nan, MD  tiZANidine (ZANAFLEX) 4 MG tablet Take 1 tablet (4 mg total) by mouth every 8 (eight) hours as needed for muscle spasms. Do not take with alcohol or while driving or operating heavy machinery.  May cause drowsiness. 12/05/23  Yes Valentino Nose, NP  fluticasone (FLONASE) 50 MCG/ACT nasal  spray Place 1 spray into both nostrils daily. 1 spray in each nostril every day 04/07/20   Theadore Nan, MD  ketoconazole (NIZORAL) 2 % cream Apply 1 application topically daily. 09/28/21   Theadore Nan, MD  Olopatadine HCl 0.2 % SOLN INSTILL 1 DROP INTO EYE DAILY 09/11/21   Theadore Nan, MD  triamcinolone ointment (KENALOG) 0.1 % APPLY TO AFFECTED AREA TWICE A DAY Patient not taking: Reported on 09/28/2021 04/07/20   Theadore Nan, MD  triamcinolone ointment (KENALOG) 0.5 % Apply 1 application topically 2 (two) times daily. For moderate to severe eczema.  Do not use for more than 1 week at a time. Patient not taking: Reported on 09/28/2021 04/07/20   Theadore Nan, MD  VYVANSE 40 MG capsule Take 1 capsule (40 mg total) by mouth every morning. 11/28/21 12/29/21  Theadore Nan, MD  VYVANSE 40 MG capsule Take 1 capsule (40 mg total) by mouth every morning. 10/29/21 11/28/21  Theadore Nan, MD  VYVANSE 40 MG capsule Take 1 capsule (40 mg total) by mouth every morning. 09/28/21 10/28/21  Theadore Nan, MD    Family History Family History  Problem Relation Age of Onset   Hypertension Mother    Asthma Brother    Eczema Brother    Thyroid disease Other     Social History Social History   Tobacco Use   Smoking status: Never   Smokeless tobacco: Never  Substance Use Topics  Alcohol use: No   Drug use: No     Allergies   Patient has no known allergies.   Review of Systems Review of Systems Per HPI  Physical Exam Triage Vital Signs ED Triage Vitals  Encounter Vitals Group     BP 12/05/23 1441 104/69     Systolic BP Percentile --      Diastolic BP Percentile --      Pulse Rate 12/05/23 1441 70     Resp 12/05/23 1441 16     Temp 12/05/23 1441 97.8 F (36.6 C)     Temp Source 12/05/23 1441 Oral     SpO2 12/05/23 1441 95 %     Weight --      Height --      Head Circumference --      Peak Flow --      Pain Score 12/05/23 1439 8     Pain Loc --       Pain Education --      Exclude from Growth Chart --    No data found.  Updated Vital Signs BP 104/69   Pulse 70   Temp 97.8 F (36.6 C) (Oral)   Resp 16   LMP 11/14/2023   SpO2 95%   Visual Acuity Right Eye Distance:   Left Eye Distance:   Bilateral Distance:    Right Eye Near:   Left Eye Near:    Bilateral Near:     Physical Exam Vitals and nursing note reviewed.  Constitutional:      General: She is not in acute distress.    Appearance: Normal appearance. She is not toxic-appearing.  HENT:     Mouth/Throat:     Mouth: Mucous membranes are moist.     Pharynx: Oropharynx is clear.  Pulmonary:     Effort: Pulmonary effort is normal. No respiratory distress.  Musculoskeletal:       Back:     Comments: Inspection: no swelling, bruising, obvious deformity or redness Palpation: tender to palpation to very distinct area in area marked; no obvious deformities palpated ROM: Full ROM to lumbar spine Strength: 5/5 bilateral lower extremities Neurovascular: neurovascularly intact in bilateral lower extremities   Skin:    General: Skin is warm and dry.     Capillary Refill: Capillary refill takes less than 2 seconds.     Coloration: Skin is not jaundiced or pale.     Findings: No erythema.  Neurological:     Mental Status: She is alert and oriented to person, place, and time.  Psychiatric:        Behavior: Behavior is cooperative.      UC Treatments / Results  Labs (all labs ordered are listed, but only abnormal results are displayed) Labs Reviewed - No data to display  EKG   Radiology No results found.  Procedures Procedures (including critical care time)  Medications Ordered in UC Medications  ketorolac (TORADOL) 30 MG/ML injection 30 mg (30 mg Intramuscular Given 12/05/23 1504)    Initial Impression / Assessment and Plan / UC Course  I have reviewed the triage vital signs and the nursing notes.  Pertinent labs & imaging results that were  available during my care of the patient were reviewed by me and considered in my medical decision making (see chart for details).   Patient is well-appearing, normotensive, afebrile, not tachycardic, not tachypneic, oxygenating well on room air.    1. Acute right-sided low back pain without sciatica No red flags Vitals  and exam are reassuring X-ray imaging deferred using shared patient decision making Pain treated with Toradol 30 mg IM in urgent care today, start muscle relaxant and continue Tylenol, avoid NSAIDs for 48 hours and alternate Tylenol and NSAIDs Recommended light range of motion and stretching, heat/ice Return and ER precautions discussed  The patient was given the opportunity to ask questions.  All questions answered to their satisfaction.  The patient is in agreement to this plan.    Final Clinical Impressions(s) / UC Diagnoses   Final diagnoses:  Acute right-sided low back pain without sciatica     Discharge Instructions      We have given you an anti-inflammatory injection today to help with pain.  Please avoid NSAIDs for 48 hours, then you can take ibuprofen 400 mg every 8 hours for pain.  Continue Tylenol 500 to 1000 mg every 6 hours for pain.  You can also take the tizanidine every 8 hours as needed for muscular pain.  Seek care if symptoms do not improve with treatment.    ED Prescriptions     Medication Sig Dispense Auth. Provider   tiZANidine (ZANAFLEX) 4 MG tablet Take 1 tablet (4 mg total) by mouth every 8 (eight) hours as needed for muscle spasms. Do not take with alcohol or while driving or operating heavy machinery.  May cause drowsiness. 30 tablet Valentino Nose, NP      PDMP not reviewed this encounter.   Valentino Nose, NP 12/05/23 [id number]

## 2025-02-24 ENCOUNTER — Ambulatory Visit: Admitting: Physician Assistant
